# Patient Record
Sex: Male | Born: 1990 | Race: Black or African American | Hispanic: No | Marital: Single | Smoking: Never smoker
Health system: Southern US, Community
[De-identification: ages and names within clinical notes are randomized; demographics above are authoritative.]

---
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
## 2010-08-01 LAB — GC/CHLAMYDIA PROBE AMP, GENITAL
Chlamydia, DNA Probe: NEGATIVE
GC Probe Amp, Genital: NEGATIVE

## 2010-08-01 LAB — RPR: RPR Ser Ql: NONREACTIVE

## 2014-06-19 ENCOUNTER — Emergency Department (HOSPITAL_COMMUNITY)
Admission: EM | Admit: 2014-06-19 | Discharge: 2014-06-19 | Discharge status: Against Medical Advice | Payer: Self-pay | Attending: Emergency Medicine | Admitting: Emergency Medicine

## 2014-06-19 ENCOUNTER — Emergency Department (HOSPITAL_COMMUNITY)
Admission: EM | Admit: 2014-06-19 | Discharge: 2014-06-19 | Disposition: A | Discharge status: Home / Self Care | Payer: Self-pay | Attending: Emergency Medicine | Admitting: Emergency Medicine

## 2014-06-19 ENCOUNTER — Encounter (HOSPITAL_COMMUNITY): Payer: Self-pay | Admitting: Emergency Medicine

## 2014-06-19 DIAGNOSIS — R112 Nausea with vomiting, unspecified: Secondary | ICD-10-CM | POA: Insufficient documentation

## 2014-06-19 DIAGNOSIS — R109 Unspecified abdominal pain: Secondary | ICD-10-CM | POA: Insufficient documentation

## 2014-06-19 DIAGNOSIS — Z5321 Procedure and treatment not carried out due to patient leaving prior to being seen by health care provider: Secondary | ICD-10-CM

## 2014-06-19 LAB — URINALYSIS, ROUTINE W REFLEX MICROSCOPIC
Glucose, UA: NEGATIVE mg/dL
Ketones, ur: >80 mg/dL — AB
Leukocytes, UA: NEGATIVE
Nitrite: NEGATIVE
Protein, ur: 30 mg/dL — AB
Specific Gravity, Urine: 1.037 — ABNORMAL HIGH (ref 1.005–1.030)
Urobilinogen, UA: 1 mg/dL (ref 0.0–1.0)
pH: 6.5 (ref 5.0–8.0)

## 2014-06-19 LAB — URINE MICROSCOPIC-ADD ON

## 2014-06-19 LAB — CBC WITH DIFFERENTIAL/PLATELET
Basophils Absolute: 0 10*3/uL (ref 0.0–0.1)
Basophils Relative: 0 % (ref 0–1)
Eosinophils Absolute: 0 10*3/uL (ref 0.0–0.7)
Eosinophils Relative: 0 % (ref 0–5)
HCT: 42.2 % (ref 39.0–52.0)
Hemoglobin: 14.9 g/dL (ref 13.0–17.0)
Lymphocytes Relative: 15 % (ref 12–46)
Lymphs Abs: 1.7 10*3/uL (ref 0.7–4.0)
MCH: 30.2 pg (ref 26.0–34.0)
MCHC: 35.3 g/dL (ref 30.0–36.0)
MCV: 85.4 fL (ref 78.0–100.0)
Monocytes Absolute: 1.5 10*3/uL — ABNORMAL HIGH (ref 0.1–1.0)
Monocytes Relative: 13 % — ABNORMAL HIGH (ref 3–12)
Neutro Abs: 8.2 10*3/uL — ABNORMAL HIGH (ref 1.7–7.7)
Neutrophils Relative %: 72 % (ref 43–77)
Platelets: 206 10*3/uL (ref 150–400)
RBC: 4.94 MIL/uL (ref 4.22–5.81)
RDW: 12.8 % (ref 11.5–15.5)
WBC: 11.4 10*3/uL — ABNORMAL HIGH (ref 4.0–10.5)

## 2014-06-19 LAB — COMPREHENSIVE METABOLIC PANEL
ALT: 33 U/L (ref 0–53)
AST: 17 U/L (ref 0–37)
Albumin: 4.3 g/dL (ref 3.5–5.2)
Alkaline Phosphatase: 74 U/L (ref 39–117)
Anion gap: 9 (ref 5–15)
BUN: 9 mg/dL (ref 6–23)
CO2: 25 mmol/L (ref 19–32)
Calcium: 9.2 mg/dL (ref 8.4–10.5)
Chloride: 104 mmol/L (ref 96–112)
Creatinine, Ser: 0.94 mg/dL (ref 0.50–1.35)
GFR calc Af Amer: >90 mL/min (ref >90)
GFR calc non Af Amer: >90 mL/min (ref >90)
Glucose, Bld: 110 mg/dL — ABNORMAL HIGH (ref 70–99)
Potassium: 3.9 mmol/L (ref 3.5–5.1)
Sodium: 138 mmol/L (ref 135–145)
Total Bilirubin: 0.6 mg/dL (ref 0.3–1.2)
Total Protein: 7.7 g/dL (ref 6.0–8.3)

## 2014-06-19 LAB — LIPASE, BLOOD: Lipase: 19 U/L (ref 11–59)

## 2014-06-19 MED ORDER — ONDANSETRON HCL 4 MG/2ML IJ SOLN
4.0000 mg | Freq: Once | INTRAMUSCULAR | Status: AC
  Administered 2014-06-19: 4 mg via INTRAVENOUS
  Filled 2014-06-19: qty 2

## 2014-06-19 MED ORDER — DICYCLOMINE HCL 20 MG PO TABS: 20.0000 mg | ORAL_TABLET | Freq: Three times a day (TID) | ORAL | Status: AC

## 2014-06-19 MED ORDER — KETOROLAC TROMETHAMINE 30 MG/ML IJ SOLN
30.0000 mg | Freq: Once | INTRAMUSCULAR | Status: AC
  Administered 2014-06-19: 30 mg via INTRAVENOUS
  Filled 2014-06-19: qty 1

## 2014-06-19 MED ORDER — SODIUM CHLORIDE 0.9 % IV BOLUS (SEPSIS)
1000.0000 mL | Freq: Once | INTRAVENOUS | Status: AC
  Administered 2014-06-19: 1000 mL via INTRAVENOUS

## 2014-06-19 MED ORDER — PROMETHAZINE HCL 25 MG PO TABS: 25.0000 mg | ORAL_TABLET | Freq: Four times a day (QID) | ORAL | Status: AC | PRN

## 2014-06-19 NOTE — ED Notes (Addendum)
Offered patient Zofran patient denies request. Pt upset about having to waiting.

## 2014-06-19 NOTE — Discharge Instructions (Signed)
Your symptoms are likely caused by a viral illness. There was a small amount of bright protein in your urine. No sign of infection. This is something that will need to be followed up non-emergently with a primary care physician. If you do not have a PCP may contact, and health and wellness for an appointment. We have also giving you a list for other outpatient resources.   Nausea and Vomiting Nausea is a sick feeling that often comes before throwing up (vomiting). Vomiting is a reflex where stomach contents come out of your mouth. Vomiting can cause severe loss of body fluids (dehydration). Children and elderly adults can become dehydrated quickly, especially if they also have diarrhea. Nausea and vomiting are symptoms of a condition or disease. It is important to find the cause of your symptoms. CAUSES   Direct irritation of the stomach lining. This irritation can result from increased acid production (gastroesophageal reflux disease), infection, food poisoning, taking certain medicines (such as nonsteroidal anti-inflammatory drugs), alcohol use, or tobacco use.  Signals from the brain.These signals could be caused by a headache, heat exposure, an inner ear disturbance, increased pressure in the brain from injury, infection, a tumor, or a concussion, pain, emotional stimulus, or metabolic problems.  An obstruction in the gastrointestinal tract (bowel obstruction).  Illnesses such as diabetes, hepatitis, gallbladder problems, appendicitis, kidney problems, cancer, sepsis, atypical symptoms of a heart attack, or eating disorders.  Medical treatments such as chemotherapy and radiation.  Receiving medicine that makes you sleep (general anesthetic) during surgery. DIAGNOSIS Your caregiver may ask for tests to be done if the problems do not improve after a few days. Tests may also be done if symptoms are severe or if the reason for the nausea and vomiting is not clear. Tests may include:  Urine  tests.  Blood tests.  Stool tests.  Cultures (to look for evidence of infection).  X-rays or other imaging studies. Test results can help your caregiver make decisions about treatment or the need for additional tests. TREATMENT You need to stay well hydrated. Drink frequently but in small amounts.You may wish to drink water, sports drinks, clear broth, or eat frozen ice pops or gelatin dessert to help stay hydrated.When you eat, eating slowly may help prevent nausea.There are also some antinausea medicines that may help prevent nausea. HOME CARE INSTRUCTIONS   Take all medicine as directed by your caregiver.  If you do not have an appetite, do not force yourself to eat. However, you must continue to drink fluids.  If you have an appetite, eat a normal diet unless your caregiver tells you differently.  Eat a variety of complex carbohydrates (rice, wheat, potatoes, bread), lean meats, yogurt, fruits, and vegetables.  Avoid high-fat foods because they are more difficult to digest.  Drink enough water and fluids to keep your urine clear or pale yellow.  If you are dehydrated, ask your caregiver for specific rehydration instructions. Signs of dehydration may include:  Severe thirst.  Dry lips and mouth.  Dizziness.  Dark urine.  Decreasing urine frequency and amount.  Confusion.  Rapid breathing or pulse. SEEK IMMEDIATE MEDICAL CARE IF:   You have blood or brown flecks (like coffee grounds) in your vomit.  You have black or bloody stools.  You have a severe headache or stiff neck.  You are confused.  You have severe abdominal pain.  You have chest pain or trouble breathing.  You do not urinate at least once every 8 hours.  You develop  cold or clammy skin.  You continue to vomit for longer than 24 to 48 hours.  You have a fever. MAKE SURE YOU:   Understand these instructions.  Will watch your condition.  Will get help right away if you are not doing  well or get worse. Document Released: 04/16/2005 Document Revised: 07/09/2011 Document Reviewed: 09/13/2010 [facility] Patient Information 2015 [place], [place]. This information is not intended to replace advice given to you by your health care provider. Make sure you discuss any questions you have with your health care provider.      Emergency Department Resource Guide 1) Find a Doctor and Pay Out of Pocket Although you won't have to find out who is covered by your insurance plan, it is a good idea to ask around and get recommendations. You will then need to call the office and see if the doctor you have chosen will accept you as a new patient and what types of options they offer for patients who are self-pay. Some doctors offer discounts or will set up payment plans for their patients who do not have insurance, but you will need to ask so you aren't surprised when you get to your appointment.  2) Contact Your Local Health Department Not all health departments have doctors that can see patients for sick visits, but many do, so it is worth a call to see if yours does. If you don't know where your local health department is, you can check in your phone book. The CDC also has a tool to help you locate your state's health department, and many state websites also have listings of all of their local health departments.  3) Find a Walk-in Clinic If your illness is not likely to be very severe or complicated, you may want to try a walk in clinic. These are popping up all over the country in pharmacies, drugstores, and shopping centers. They're usually staffed by nurse practitioners or physician assistants that have been trained to treat common illnesses and complaints. They're usually fairly quick and inexpensive. However, if you have serious medical issues or chronic medical problems, these are probably not your best option.  No Primary Care Doctor: - Call Health Connect at  [phone] - they can help you  locate a primary care doctor that  accepts your insurance, provides certain services, etc. - Physician Referral Service- [phone]  Chronic Pain Problems: Organization         Address  Phone   Notes  [facility]  [phone] Patients need to be referred by their primary care doctor.   Medication Assistance: Organization         Address  Phone   Notes  [facility] Medication [facility] [address] [place], [place] [address] [phone] --Must be a resident of [facility] [place] -- Must have NO insurance coverage whatsoever (no Medicaid/ Medicare, etc.) -- The pt. MUST have a primary care doctor that directs their care regularly and follows them in the community   MedAssist  [phone]) [phone]   [employer]  [phone]) [phone]    Agencies that provide inexpensive medical care: Organization         Address  Phone   Notes  [facility]  [phone]) [phone]   [facility]    [phone]) [phone]   [facility] [address] [place], [place] [address] [phone]   [facility] [place] [address], [place] (  [phone]) [phone]   [facility]anned Parenthood    [phone](336) [phone]   [facility]    [phone]) [phone]   [facility]  [address], [place] Phone:  [phone]) [phone], Fax:  [phone] Hours of Operation:  9 am - 6 pm, M-F.  Also accepts Medicaid/Medicare and self-pay.  [facility]  [address], [place] Phone: [phone]) [phone], Fax: [phone]. Hours of Operation:  8:30 am - 5:30 pm, M-F.  Also accepts Medicaid and self-pay.  [facility] [place] [address], [place] Phone: [phone]) [phone]   [facility] [address], [place], [place] [phone], Ext. [phone] Mondays & Thursdays: 7-9 AM.  First 15 patients are seen on a first come, first serve basis.    Medicaid-accepting [facility]  Providers:  Organization         Address  Phone   Notes  [facility] [address], [place] [phone] Also accepts self-pay patients.  [facility] [address], [place]  [phone]) [phone]   [facility] [address], [place] [phone]) [phone]   [facility] [address] [place] [address], [place] [phone]) [phone]   Renaye RakersVeita Bland [address], [place]   [phone]) [phone] Only accepts [place] Access [place] patients after they have their name applied to their card.   Self-Pay (no insurance) in [facility] [place]:  Organization         Address  Phone   Notes  Sickle Cell Patients, [facility] [address], [place] [phone]   [facility] [address], [place] [phone]) [phone]   [facility] [place]  [address], [place] [phone]) [phone]   [facility]/Dr. Osei-Bonsu  [address] [place] [address], [place] or [id number], [place] [phone] Phone number for both [place] and [place] locations is the same.  [facility] [address], [place] [phone]) [phone]   [facility] [place] [address] [place] [address], [place] or [address] [phone]) [phone]   [facility] [address], [place] [phone]) [phone], phone; [phone]) [phone], fax Sees patients 1st and 3rd Saturday of every month.  Must not qualify for public or private insurance (i.e. Medicaid, Medicare, [place] Health Choice, Veterans' Benefits)  Household income should be no more than 200% of the poverty level The clinic cannot treat you if you are pregnant or think you are pregnant  Sexually transmitted diseases are not treated at the clinic.    Dental Care: Organization         Address  Phone  Notes  [facility] of [facility] [address], [place] [phone] Accepts children up to age 24 who are enrolled in [place] or [place] Health Choice; pregnant women with a Medicaid card; and children who have applied for Medicaid or [place] Health Choice, but were declined, whose parents can pay a reduced fee at time of service.  [facility] of [facility] [place]  [address], [place] [phone]) [phone] Accepts children up to age 24 who are enrolled in [place] or [place] Health Choice; pregnant women with a Medicaid card; and children who have applied for Medicaid or [place] Health  Choice, but were declined, whose parents can pay a reduced fee at time of service.  [facility]  [address] [place], [place] [phone] Patients are seen by appointment only. Walk-ins are not accepted. [facility] will see patients 45 years of age and older. Monday - Tuesday (8am-5pm) Most Wednesdays (8:30-5pm) $30 per visit, cash only  [facility] Adult Dental Access PROGRAM  [address], [facility] [phone] Patients are seen by appointment only. Walk-ins are not accepted. [facility] will see patients 46 years of age and older. One Wednesday Evening (Monthly: Volunteer Based).  $30 per visit, cash only  [employer]  [phone] for adults; Children under age 74, call Graduate Pediatric Dentistry at [phone]. Children aged 77-14, please call [phone] to request a pediatric application.  Dental services are provided in all areas of dental care including fillings, crowns and bridges, complete and partial dentures, implants, gum treatment, root canals, and extractions. Preventive care is also provided. Treatment is provided to both adults and children. Patients are selected via a lottery and there is often a waiting list.   [facility] [address], [place]  [phone] [email or web address]   [facility]  [address] [place], [place] [phone], Ext. [phone] Second and Fourth Thursday of each month, opens at 6:30 AM; Clinic ends at 9 AM.  Patients are seen on a first-come first-served basis, and a limited number are seen during each clinic.   [place]  [address] [place], [place] [phone]   Eligibility Requirements You must have lived in [place], [place], or [place] counties for at least the last three months.   You cannot be eligible for state or federal sponsored [place], including [employer], [place], or [employer].   You generally cannot be eligible for healthcare insurance through your employer.    How to apply: Eligibility screenings are held every Tuesday and Wednesday afternoon from 1:00 pm until 4:00 pm. You do not need an appointment for the interview!  [facility] [address], [place], [place] [phone]   [facility] Health Department  [phone]   [facility] Health Department  [phone]   [facility] Health Department  [phone]    Behavioral Health Resources in the Community: Intensive Outpatient Programs Organization         Address  Phone  Notes  [facility] Services [address] [place], [place] [phone]   [facility] Outpatient [address] [place], [place] [phone]   [facility]: [facility] [address], [place], [place]  [phone]   [facility] Mental Health [address],  [place], [place] [phone] or [phone]   Substance Abuse Resources Organization         Address  Phone  Notes  Al[facility] Drug Services  [phone]   [facility]  [phone]   [facility] [place]  [phone]   Floydene Flock  [phone]   Residential & Outpatient Substance Abuse Program  [phone]   Psychological Services Organization         Address  Phone  Notes  [facility]  [phone]     [facility] Services  [phone]   [facility] Mental Health [address], [place] [phone] or [phone]    Mobile Crisis Teams Organization         Address  Phone  Notes  [facility], Mobile Crisis Care Unit  [phone]   [facility]  [address] [place], [place] [phone]   [facility] [address] [place] [phone]    Self-Help/Support Groups Organization         Address  Phone             Notes  [facility] Assoc[facility] of [place] - variety of support groups  336- I7437963 Call for more information  Narcotics Anonymous (NA), Caring [facility] [address], [employer] [place]  2 meetings at this location   Statistician         Address  Phone  Notes  [facility] [address],    [place]  [phone]   [facility]  [address] [place] [address], [place], [place] [phone]   [facility] [address] [place], [place] [phone] Admissions: 8am-3pm M-F  [facility] [address]    [place], [place] [phone]   [facility] [address] [place], [place], [place] [phone]   [facility] [address],  [place], [place] [phone]   [facility] - Intensive Outpatient [address], [place], [place] [phone]   [facility] ([facility]iction [facility] [address] [place]  [place], [place] [phone] or [phone]   [facility] ([facility]) [address] [place], [place] [phone] Accepts Medicaid  [place] [address],  [place] [phone] Substance Abuse/Addiction Treatment   [facility] Organization         Address  Phone  Notes  [facility]  [phone]   Angie Fava, PhD [address] Kn[address] [place], [place]   [phone] or [phone]    [facility] Behavioral   [address] [place], [place] [phone]   [facility] [address], [place], [place] [phone] Insurance/Medicaid/sponsorship through [facility] and Families [address]                                    [place], [place] [phone] Therapy/tele-psych/case  [facility] [address], [place] [phone]    Dr. Lolly Mustache  [phone]   [facility] [place]  [employer] [facility] Dept. 1) [address], [place] 2) [address] [place] 3)  [address], [place] [phone]  [phone]   [facility] Child Abuse Hotline 313-657-4144 or [phone] (After Hours)

## 2014-06-19 NOTE — ED Notes (Signed)
Pt called in triage 4 times and is not answering, not in lobby. Other pts waiting sts that pt walked ooutside. Pt not found

## 2014-06-19 NOTE — ED Notes (Signed)
MD at bedside. 

## 2014-06-19 NOTE — ED Provider Notes (Signed)
TIME SEEN: 1:20 AM  CHIEF COMPLAINT: Abdominal pain, nausea, dry heaving  HPI: Pt is a 24 y.o. male with no significant past medical history and no surgical history who presents emergency department with complaints of abdominal pain in the mid to lower abdomen that started 2 days ago. States started after eating [employer] which he thinks may have been bad. No one else ate similar food. He has had nausea and dry heaving and one episode of vomiting. Denies any diarrhea, bloody stool or melena. No dysuria or hematuria. No fevers or chills. Ex or recent travel.  ROS: See HPI Constitutional: no fever  Eyes: no drainage  ENT: no runny nose   Cardiovascular:  no chest pain  Resp: no SOB  GI: no vomiting GU: no dysuria Integumentary: no rash  Allergy: no hives  Musculoskeletal: no leg swelling  Neurological: no slurred speech ROS otherwise negative  PAST MEDICAL HISTORY/PAST SURGICAL HISTORY:  History reviewed. No pertinent past medical history.  MEDICATIONS:  Prior to Admission medications   Not on File    ALLERGIES:  No Known Allergies  SOCIAL HISTORY:  History  Substance Use Topics  . Smoking status: Never Smoker   . Smokeless tobacco: Not on file  . Alcohol Use: No    FAMILY HISTORY: History reviewed. No pertinent family history.  EXAM: BP 136/71 mmHg  Pulse 62  Temp(Src) 98 F (36.7 C) (Oral)  Resp 16  SpO2 100% CONSTITUTIONAL: Alert and oriented and responds appropriately to questions. Well-appearing; well-nourished HEAD: Normocephalic EYES: Conjunctivae clear, PERRL ENT: normal nose; no rhinorrhea; moist mucous membranes; pharynx without lesions noted NECK: Supple, no meningismus, no LAD  CARD: RRR; S1 and S2 appreciated; no murmurs, no clicks, no rubs, no gallops RESP: Normal chest excursion without splinting or tachypnea; breath sounds clear and equal bilaterally; no wheezes, no rhonchi, no rales,  ABD/GI: Normal bowel sounds; non-distended; soft, non-tender, no  rebound, no guarding, no peritoneal signs, no tenderness at McBurney's point, negative Murphy sign BACK:  The back appears normal and is non-tender to palpation, there is no CVA tenderness EXT: Normal ROM in all joints; non-tender to palpation; no edema; normal capillary refill; no cyanosis    SKIN: Normal color for age and race; warm NEURO: Moves all extremities equally PSYCH: The patient's mood and manner are appropriate. Grooming and personal hygiene are appropriate.  MEDICAL DECISION MAKING: Patient here with abdominal pain, nausea and vomiting. Maybe viral illness. Abdominal exam benign. Patient is well-appearing, nontoxic, afebrile and hemodynamically stable. At this time I do not feel he needs abdominal imaging. We'll obtain labs, urine. Will give IV fluids, Toradol and Zofran.  ED PROGRESS: Patient's labs are unremarkable other than mild leukocytosis with left shift. Urine does show trace hemoglobin small amount of protein. Have discussed this with family and that this needs to be followed up by primary care physician. No other sign of infection. He does have large ketones but has received a liter of IV fluids and is tolerating by mouth. Reports feeling much better after Toradol, Zofran. We'll discharge home with prescriptions for Bentyl, Zofran. Suspect viral illness. Discussed at length return precautions. He verbalizes understanding and is comfortable with plan.     Layla MawKristen N Rolfe Hartsell, DO 06/19/14 0430

## 2014-06-19 NOTE — ED Notes (Signed)
Pt reports having abdominal pain since last night. Pt reports pain is in middle of abdomen. Pt states he feels like he has to vomit but is unable to. Rate pain 10/10

## 2014-06-19 NOTE — ED Notes (Addendum)
Pt from home c/o abdominal pain, nausea, and vomiting x two days after eating [employer]. Pt was seen last night for same.

## 2014-06-19 NOTE — ED Notes (Signed)
Patient ambulatory from triage without difficulty Patient alert and oriented x 4 No active vomiting at this time Patient in NAD

## 2014-06-19 NOTE — ED Notes (Signed)

## 2020-11-16 ENCOUNTER — Encounter: Payer: Self-pay | Admitting: *Deleted
